# Patient Record
Sex: Male | Born: 1960 | Race: White | Hispanic: No | Marital: Married
Health system: Southern US, Community
[De-identification: ages and names within clinical notes are randomized; demographics above are authoritative.]

## PROBLEM LIST (undated history)

## (undated) DIAGNOSIS — S069X9A Unspecified intracranial injury with loss of consciousness of unspecified duration, initial encounter: Secondary | ICD-10-CM

## (undated) DIAGNOSIS — S069XAA Unspecified intracranial injury with loss of consciousness status unknown, initial encounter: Secondary | ICD-10-CM

---

## 2000-12-14 ENCOUNTER — Inpatient Hospital Stay (HOSPITAL_COMMUNITY): Admission: EM | Admit: 2000-12-14 | Discharge: 2000-12-15 | Payer: Self-pay | Admitting: Emergency Medicine

## 2000-12-15 ENCOUNTER — Encounter: Payer: Self-pay | Admitting: Internal Medicine

## 2019-05-27 ENCOUNTER — Emergency Department (HOSPITAL_COMMUNITY): Payer: BC Managed Care – PPO

## 2019-05-27 ENCOUNTER — Other Ambulatory Visit: Payer: Self-pay

## 2019-05-27 ENCOUNTER — Encounter (HOSPITAL_COMMUNITY): Payer: Self-pay | Admitting: Emergency Medicine

## 2019-05-27 ENCOUNTER — Emergency Department (HOSPITAL_COMMUNITY)
Admission: EM | Admit: 2019-05-27 | Discharge: 2019-05-27 | Disposition: A | Discharge status: Home / Self Care | Payer: BC Managed Care – PPO | Attending: Emergency Medicine | Admitting: Emergency Medicine

## 2019-05-27 DIAGNOSIS — R0789 Other chest pain: Secondary | ICD-10-CM | POA: Diagnosis not present

## 2019-05-27 DIAGNOSIS — E860 Dehydration: Secondary | ICD-10-CM | POA: Insufficient documentation

## 2019-05-27 DIAGNOSIS — R55 Syncope and collapse: Secondary | ICD-10-CM | POA: Diagnosis not present

## 2019-05-27 HISTORY — DX: Unspecified intracranial injury with loss of consciousness of unspecified duration, initial encounter: S06.9X9A

## 2019-05-27 HISTORY — DX: Unspecified intracranial injury with loss of consciousness status unknown, initial encounter: S06.9XAA

## 2019-05-27 LAB — CBC
HCT: 49.8 % (ref 39.0–52.0)
Hemoglobin: 17 g/dL (ref 13.0–17.0)
MCH: 30 pg (ref 26.0–34.0)
MCHC: 34.1 g/dL (ref 30.0–36.0)
MCV: 88 fL (ref 80.0–100.0)
Platelets: 243 10*3/uL (ref 150–400)
RBC: 5.66 MIL/uL (ref 4.22–5.81)
RDW: 13.3 % (ref 11.5–15.5)
WBC: 13.8 10*3/uL — ABNORMAL HIGH (ref 4.0–10.5)
nRBC: 0 % (ref 0.0–0.2)

## 2019-05-27 LAB — URINALYSIS, ROUTINE W REFLEX MICROSCOPIC
Bilirubin Urine: NEGATIVE
Glucose, UA: 50 mg/dL — AB
Hgb urine dipstick: NEGATIVE
Ketones, ur: NEGATIVE mg/dL
Leukocytes,Ua: NEGATIVE
Nitrite: NEGATIVE
Protein, ur: NEGATIVE mg/dL
Specific Gravity, Urine: 1.018 (ref 1.005–1.030)
pH: 6 (ref 5.0–8.0)

## 2019-05-27 LAB — TROPONIN I (HIGH SENSITIVITY)
Troponin I (High Sensitivity): 4 ng/L (ref <18)
Troponin I (High Sensitivity): 4 ng/L (ref <18)

## 2019-05-27 LAB — BASIC METABOLIC PANEL
Anion gap: 11 (ref 5–15)
BUN: 8 mg/dL (ref 6–20)
CO2: 22 mmol/L (ref 22–32)
Calcium: 9.3 mg/dL (ref 8.9–10.3)
Chloride: 104 mmol/L (ref 98–111)
Creatinine, Ser: 1.09 mg/dL (ref 0.61–1.24)
GFR calc Af Amer: >60 mL/min (ref >60)
GFR calc non Af Amer: >60 mL/min (ref >60)
Glucose, Bld: 146 mg/dL — ABNORMAL HIGH (ref 70–99)
Potassium: 3.8 mmol/L (ref 3.5–5.1)
Sodium: 137 mmol/L (ref 135–145)

## 2019-05-27 LAB — ETHANOL: Alcohol, Ethyl (B): <10 mg/dL (ref <10)

## 2019-05-27 LAB — CBG MONITORING, ED: Glucose-Capillary: 137 mg/dL — ABNORMAL HIGH (ref 70–99)

## 2019-05-27 LAB — D-DIMER, QUANTITATIVE: D-Dimer, Quant: <0.27 ug/mL-FEU (ref 0.00–0.50)

## 2019-05-27 MED ORDER — SODIUM CHLORIDE 0.9 % IV BOLUS
1000.0000 mL | Freq: Once | INTRAVENOUS | Status: AC
  Administered 2019-05-27: 1000 mL via INTRAVENOUS

## 2019-05-27 MED ORDER — SODIUM CHLORIDE 0.9% FLUSH: 3.0000 mL | Freq: Once | INTRAVENOUS | Status: DC

## 2019-05-27 MED ORDER — ONDANSETRON HCL 4 MG/2ML IJ SOLN
4.0000 mg | Freq: Once | INTRAMUSCULAR | Status: AC
  Administered 2019-05-27: 14:00:00 4 mg via INTRAVENOUS
  Filled 2019-05-27: qty 2

## 2019-05-27 NOTE — ED Notes (Signed)
Return from ct scan.

## 2019-05-27 NOTE — ED Triage Notes (Signed)
Pt reports feeling mid to left chest pain this morning with loss of consciousness. Pain is currently 5/10. Pt also endorses dizziness.

## 2019-05-27 NOTE — ED Triage Notes (Signed)
Patient arrived by [facility] FOLLOWING cp and reported syncopal episode. On arrival alert and oriented

## 2019-05-27 NOTE — ED Notes (Signed)
Patient Alert and oriented to baseline. Stable and ambulatory to baseline. Patient verbalized understanding of the discharge instructions.  Patient belongings were taken by the patient.   

## 2019-05-27 NOTE — ED Notes (Signed)
Pt given a urinal and notified that we need a urine sample. 

## 2019-05-27 NOTE — ED Notes (Signed)
Pt given a bag lunch and a water.

## 2019-05-27 NOTE — ED Notes (Signed)
Pt transported to CT ?

## 2019-05-27 NOTE — Discharge Instructions (Signed)
It is important for you to establish care with a primary care provider here. Return to the ED if you start to have worsening symptoms, worsening chest pain or shortness of breath, leg swelling, vomiting or coughing up blood, severe headache or blurry vision.

## 2019-05-27 NOTE — ED Provider Notes (Signed)
[facility]   CSN: [id number] Arrival date & time: 05/27/19  1104    History   Chief Complaint Chief Complaint  Patient presents with   Loss of Consciousness    HPI Kristopher Bradshaw is a 58 y.o. 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  States that he woke up this morning around 6 or 7 AM and had chest pain.  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  He was lifting a large battery when he states that "next thing I knew I was on the ground and they were trying to wake me up."  He denies history of similar symptoms in the past.  States that he is unsure if he had a head injury but denies any headache after the fall.  He continues to have chest pain which has been relieved with nitroglycerin given by EMS.  He states that he is starting to develop a slight headache from the nitroglycerin.  He states that he was in his usual state of health last night although he reports increased stress last night and did not eat properly for lunch or dinner.  Reports having an episode of diarrhea this morning.  He was put on an antihypertensive by his PCP when he lived in [place] but stopped taking it because he felt like it did not help him.  He has not yet established with a PCP here.  Denies any history of hyperlipidemia or diabetes.  Reports occasional alcohol use but denies any drug use, tobacco use, family or personal history of CAD, recent immobilization, leg swelling, hemoptysis, shortness of breath, fever, hormone use, abdominal pain or urinary symptoms.    HPI  Past Medical History:  Diagnosis Date   Brain injury (HCC)     There are no active problems to display for this patient.   History reviewed. No pertinent surgical history.      Home  Medications    Prior to Admission medications   Not on File    Family History No family history on file.  Social History Social History   Tobacco Use   Smoking status: Not on file  Substance Use Topics   Alcohol use: Not on file   Drug use: Not on file     Allergies   Patient has no known allergies.   Review of Systems Review of Systems  Constitutional: Negative for appetite change, chills and fever.  HENT: Negative for ear pain, rhinorrhea, sneezing and sore throat.   Eyes: Negative for photophobia and visual disturbance.  Respiratory: Negative for cough, chest tightness, shortness of breath and wheezing.   Cardiovascular: Positive for chest pain. Negative for palpitations.  Gastrointestinal: Negative for abdominal pain, blood in stool, constipation, diarrhea, nausea and vomiting.  Genitourinary: Negative for dysuria, hematuria and urgency.  Musculoskeletal: Negative for myalgias.  Skin: Negative for rash.  Neurological: Positive for syncope. Negative for dizziness, weakness and light-headedness.     Physical Exam Updated Vital Signs BP (!) 141/96    Pulse 65    Temp 98.2 F (36.8 C) (Oral)    Resp 18    SpO2 95%   Physical Exam Vitals signs and nursing note reviewed.  Constitutional:      General: He is not in acute  distress.    Appearance: He is well-developed.  HENT:     Head: Normocephalic and atraumatic.     Nose: Nose normal.  Eyes:     General: No scleral icterus.       Right eye: No discharge.        Left eye: No discharge.     Conjunctiva/sclera: Conjunctivae normal.     Pupils: Pupils are equal, round, and reactive to light.  Neck:     Musculoskeletal: Normal range of motion and neck supple.  Cardiovascular:     Rate and Rhythm: Normal rate and regular rhythm.     Heart sounds: Normal heart sounds. No murmur. No friction rub. No gallop.   Pulmonary:     Effort: Pulmonary effort is normal. No respiratory distress.     Breath sounds: Normal  breath sounds.  Abdominal:     General: Bowel sounds are normal. There is no distension.     Palpations: Abdomen is soft.     Tenderness: There is no abdominal tenderness. There is no guarding.  Musculoskeletal: Normal range of motion.     Comments: No lower extremity edema, erythema or calf tenderness bilaterally.  Skin:    General: Skin is warm and dry.     Findings: No rash.  Neurological:     General: No focal deficit present.     Mental Status: He is alert and oriented to person, place, and time. Mental status is at baseline.     Cranial Nerves: No cranial nerve deficit.     Sensory: No sensory deficit.     Motor: No weakness or abnormal muscle tone.     Coordination: Coordination normal.     Comments: Pupils reactive. No facial asymmetry noted. Cranial nerves appear grossly intact. Sensation intact to light touch on face, BUE and BLE. Strength 5/5 in BUE and BLE.      ED Treatments / Results  Labs (all labs ordered are listed, but only abnormal results are displayed) Labs Reviewed  BASIC METABOLIC PANEL - Abnormal; Notable for the following components:      Result Value   Glucose, Bld 146 (*)    All other components within normal limits  URINALYSIS, ROUTINE W REFLEX MICROSCOPIC - Abnormal; Notable for the following components:   Glucose, UA 50 (*)    All other components within normal limits  CBC - Abnormal; Notable for the following components:   WBC 13.8 (*)    All other components within normal limits  CBG MONITORING, ED - Abnormal; Notable for the following components:   Glucose-Capillary 137 (*)    All other components within normal limits  ETHANOL  D-DIMER, QUANTITATIVE (NOT AT [facility])  TROPONIN I (HIGH SENSITIVITY)  TROPONIN I (HIGH SENSITIVITY)    EKG EKG Interpretation  Date/Time:  Sunday May 27 2019 11:16:27 EDT Ventricular Rate:  65 PR Interval:  170 QRS Duration: 82 QT Interval:  398 QTC Calculation: 413 R Axis:   57 Text Interpretation:   Normal sinus rhythm Septal infarct , age undetermined Abnormal ECG Confirmed by Marianna Fussykstra, Richard ([id number]) on 05/27/2019 11:59:38 AM   Radiology Dg Chest 2 View  Result Date: 05/27/2019 CLINICAL DATA:  Left chest pain and shortness of breath. Syncopal episode. EXAM: CHEST - 2 VIEW COMPARISON:  None. FINDINGS: The heart size and mediastinal contours are normal. Probable calcified AP window lymph node and calcified granuloma in the left perihilar region. There is bilateral pleural thickening, but no significant pleural effusion. The lungs are otherwise  clear. Old rib fractures are present right. Telemetry leads overlie the chest. IMPRESSION: No evidence of acute cardiopulmonary process. Old rib fractures on the right with bilateral pleural thickening and sequela of prior granulomatous disease. Electronically Signed   By: Carey BullocksWilliam  Veazey M.D.   On: 05/27/2019 14:04   Ct Head Wo Contrast  Result Date: 05/27/2019 CLINICAL DATA:  Head trauma. High clinical risk. EXAM: CT HEAD WITHOUT CONTRAST TECHNIQUE: Contiguous axial images were obtained from the base of the skull through the vertex without intravenous contrast. COMPARISON:  None. FINDINGS: Brain: Geographic area of hypoattenuation in the cortical and subcortical right frontal lobe, likely chronic posttraumatic changes, judging by the posttraumatic changes in the overlying skull. No evidence of acute hemorrhage, hydrocephalus extra-axial collection or mass lesion. Vascular: No hyperdense vessel or unexpected calcification. Skull: Chronic appearing posttraumatic changes in the right frontal skull/frontal paranasal sinus. Negative for fracture or focal lesion. Sinuses/Orbits: Opacification of the left frontal sinus. Probably posttraumatic deformity of the right frontal sinus. Other: None. IMPRESSION: 1. Geographic area of hypoattenuation in the cortical and subcortical right frontal lobe, likely chronic posttraumatic changes, judging by the posttraumatic changes in  the overlying skull. Please correlate to patient's clinical history/prior imaging. 2. No evidence of acute intracranial hemorrhage. Electronically Signed   By: Ted Mcalpineobrinka  Dimitrova M.D.   On: 05/27/2019 14:00    Procedures Procedures (including critical care time)  Medications Ordered in ED Medications  sodium chloride flush (NS) 0.9 % injection 3 mL (has no administration in time range)  sodium chloride 0.9 % bolus 1,000 mL (0 mLs Intravenous Stopped 05/27/19 1350)  ondansetron (ZOFRAN) injection 4 mg (4 mg Intravenous Given 05/27/19 1402)     Initial Impression / Assessment and Plan / ED Course  I have reviewed the triage vital signs and the nursing notes.  Pertinent labs & imaging results that were available during my care of the patient were reviewed by me and considered in my medical decision making (see chart for details).  Clinical Course as of May 26 1538  Wynelle LinkSun May 27, 2019  1313 Troponin I (High Sensitivity) [RD]    Clinical Course User Index [RD] Milagros Lollykstra, Richard S, MD    HEAR Score: 332  58 year old male with past medical history of prior brain injury from trauma approximately 20 years ago presents to ED for chest pain syncope that began earlier today.  He woke up with a chest pain, had a syncopal episode while at work.  No history of similar symptoms in the past.  Certain develop a headache after given nitroglycerin by EMS.  Chest pain has gradually improved.  He does note increased stress lately especially last night and did not eat properly for lunch or dinner yesterday.  Had an episode of diarrhea this morning.  Notes remote history of hypertension but did denies use of antihypertensive.  Denies any daily alcohol use, tobacco use or other drug use.  On exam patient is overall well-appearing.  Chest is reproducible to palpation noted above.  No definite illogical exam noted.  No signs of head injury.  No lower extremity edema, erythema or calf tenderness noted concerning for DVT.   Lab work including BMP, CBC, d-dimer, initial ingestion troponin both negative.  EKG shows normal sinus rhythm.  Chest x-ray is unremarkable, old rib fractures noted.  CT of the head shows sequela of prior injury and surgeries but no acute findings.  Patient was given IV fluids, Zofran with significant near resolution of his symptoms.  He is  able to tolerate p.o. intake without difficulty.  He is requesting discharge.  He remains ambulatory without difficulty.  Chest pain has improved.  Suspect that symptoms could be combination of dehydration and increase in stress. Doubt CVA, ACS or other emergent cause of symptoms.  We will have him follow-up and establish care with PCP and return for worsening symptoms.  Patient is hemodynamically stable, in NAD, and able to ambulate in the ED. Evaluation does not show pathology that would require ongoing emergent intervention or inpatient treatment. I explained the diagnosis to the patient. Pain has been managed and has no complaints prior to discharge. Patient is comfortable with above plan and is stable for discharge at this time. All questions were answered prior to disposition. Strict return precautions for returning to the ED were discussed. Encouraged follow up with PCP.   An After Visit Summary was printed and given to the patient.   Portions of this note were generated with [employer]. Dictation errors may occur despite best attempts at proofreading.   Final Clinical Impressions(s) / ED Diagnoses   Final diagnoses:  Chest wall pain  Syncope and collapse  Dehydration    ED Discharge Orders    None       Delia Heady, PA-C 05/27/19 1539    Lucrezia Starch, MD 05/30/19 1427

## 2020-08-09 IMAGING — CT CT HEAD WITHOUT CONTRAST
3 of 4 series · 13 of 47 positions shown, 15 images · non-contrast
Comparison: None.

CLINICAL DATA: Head trauma. High clinical risk.

EXAM:
CT HEAD WITHOUT CONTRAST
TECHNIQUE: Contiguous axial images were obtained from the base of the skull
through the vertex without intravenous contrast.

[Series 3: head wo · axial · 0.42mm/px · z∈[-86,+29]mm · 7 of 31 slices shown, 9 images]
[im 4/31  brain]
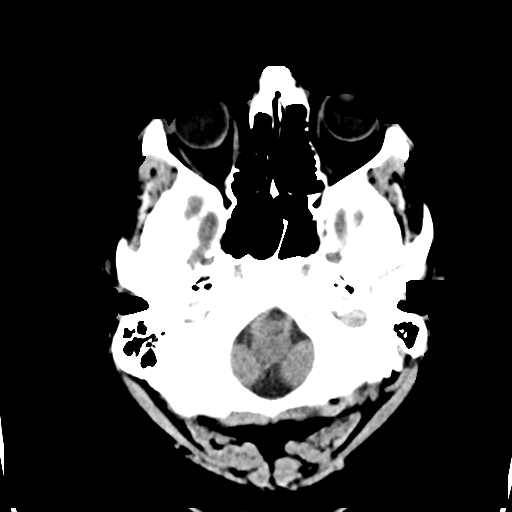
[im 4/31  bone]
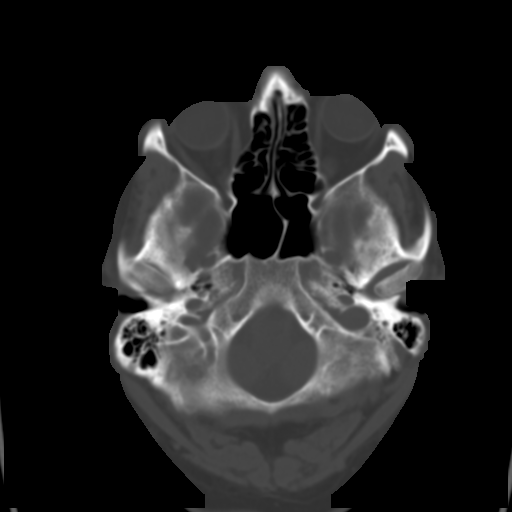
[im 8/31  brain]
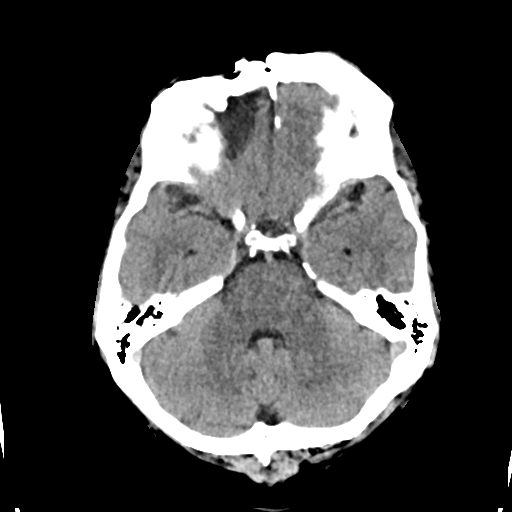
[im 12/31  brain]
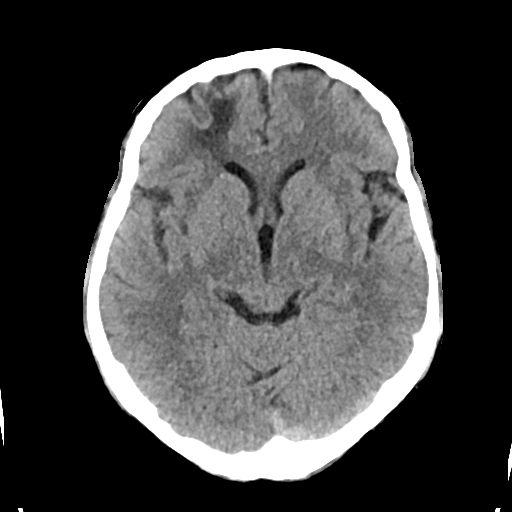
[im 16/31  brain]
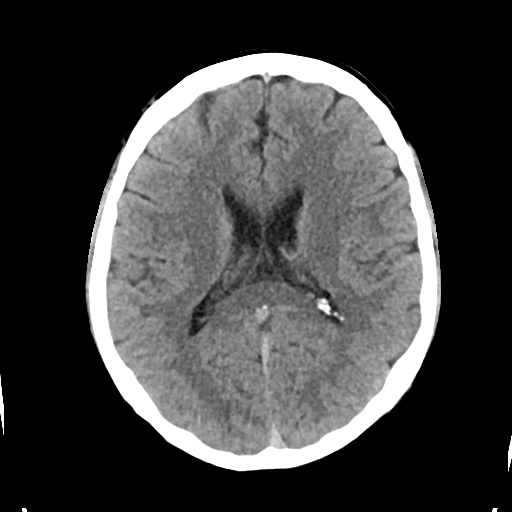
[im 19/31  brain]
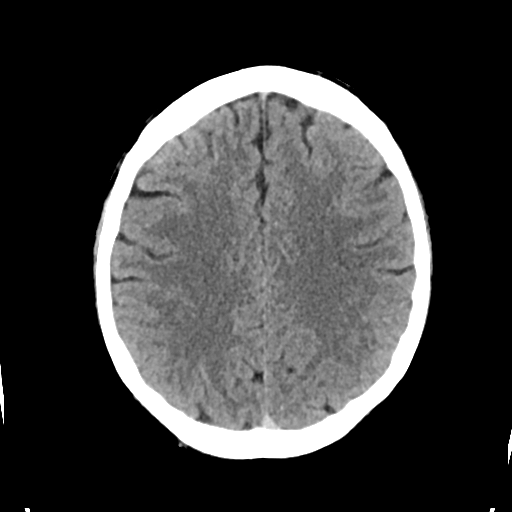
[im 19/31  bone]
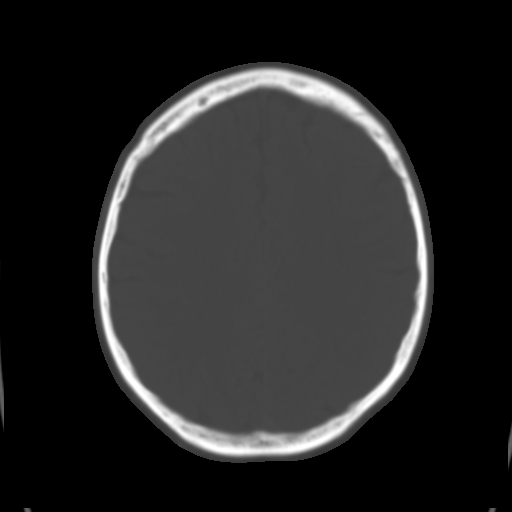
[im 23/31  brain]
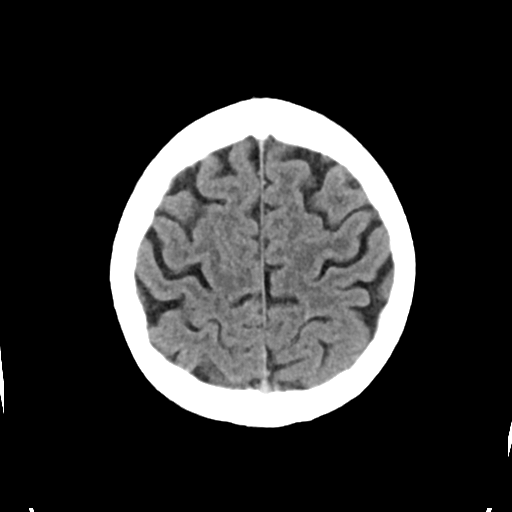
[im 27/31  brain]
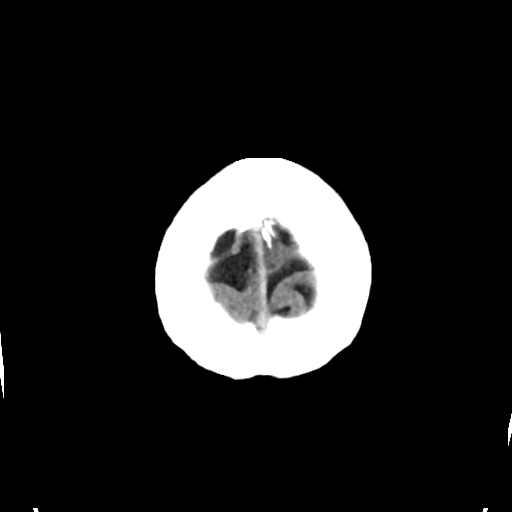

[Series 5: cor soft · coronal · 0.30mm/px · 3 of 65 slices shown]
[im 22/65  brain]
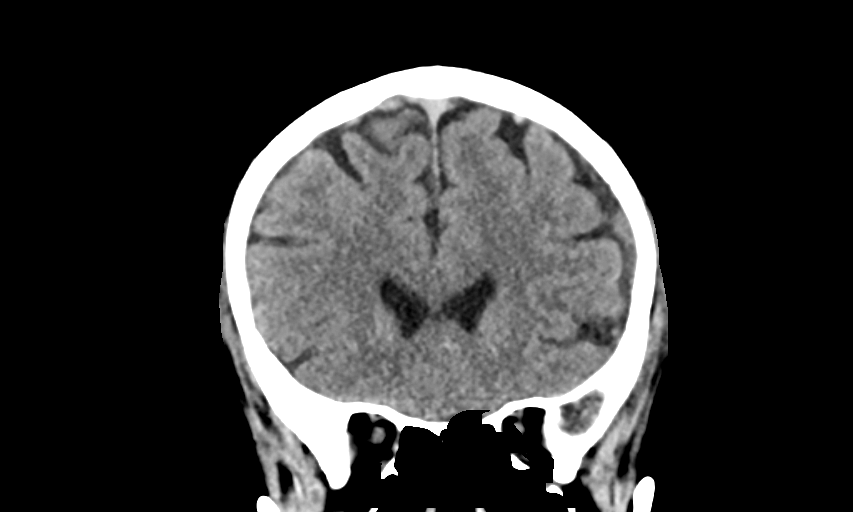
[im 29/65  brain]
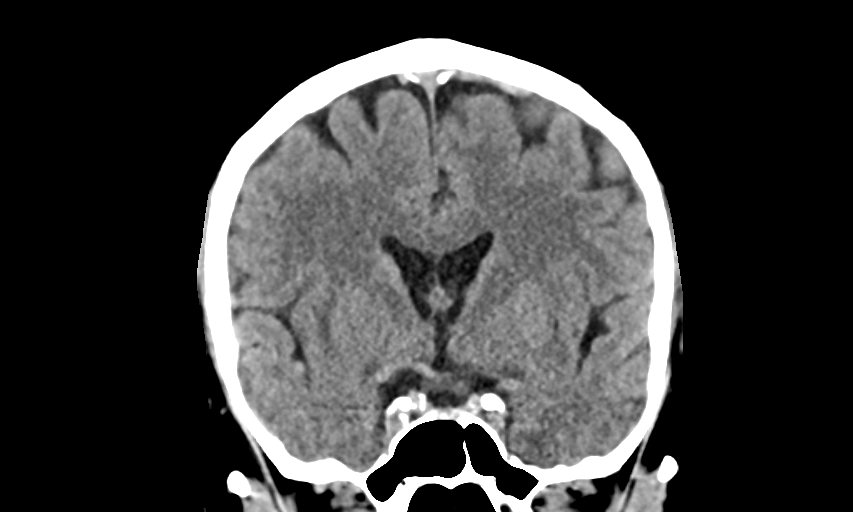
[im 36/65  brain]
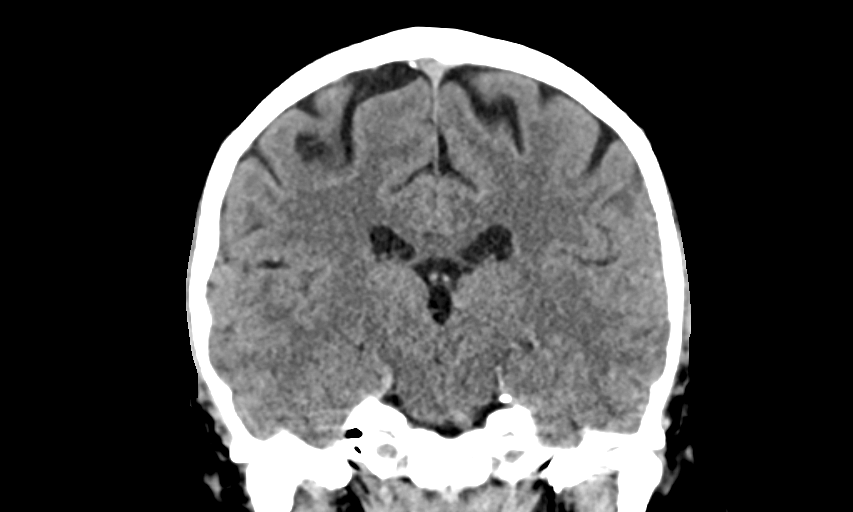

[Series 6: sag soft · sagittal · 0.33mm/px · 3 of 54 slices shown]
[im 18/54  brain]
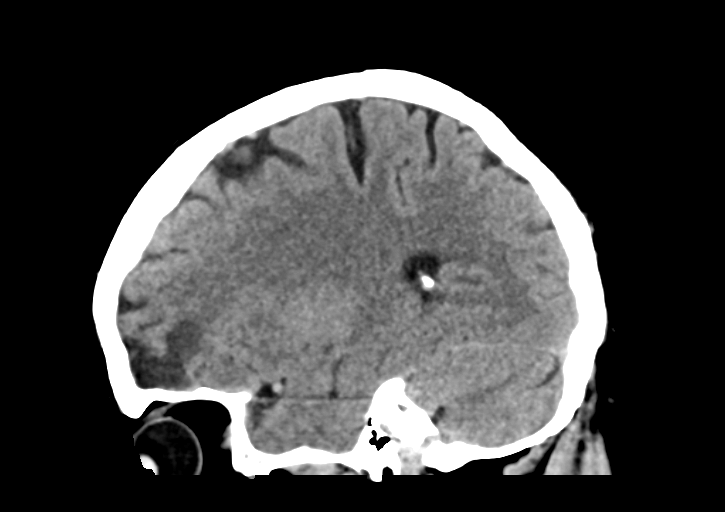
[im 27/54  brain]
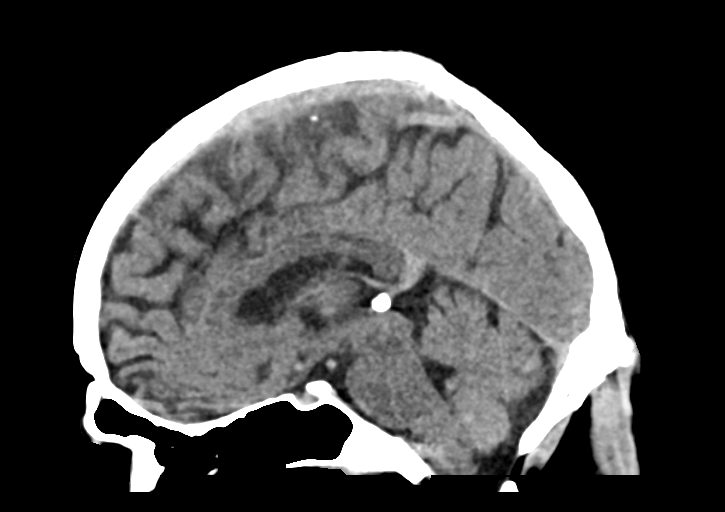
[im 36/54  brain]
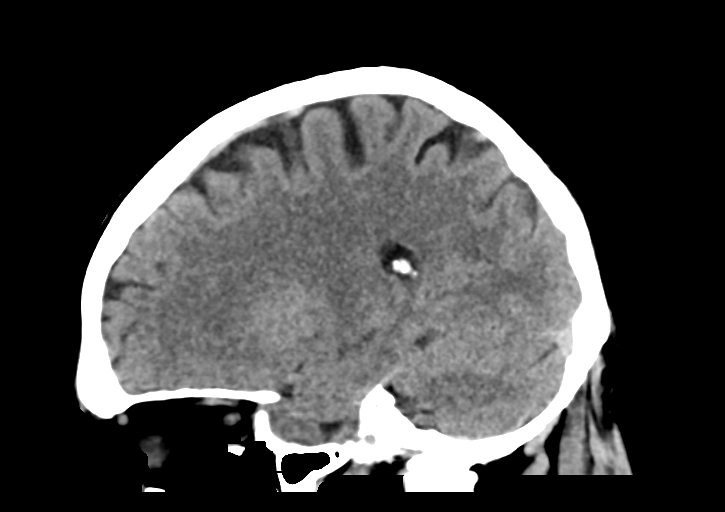

[13 of 47 positions shown; findings below may reference images not displayed]

FINDINGS: Brain: Geographic area of hypoattenuation in the cortical and
subcortical right frontal lobe, likely chronic posttraumatic
changes, judging by the posttraumatic changes in the overlying
skull. No evidence of acute hemorrhage, hydrocephalus extra-axial
collection or mass lesion.

Vascular: No hyperdense vessel or unexpected calcification.

Skull: Chronic appearing posttraumatic changes in the right frontal
skull/frontal paranasal sinus. Negative for fracture or focal
lesion.

Sinuses/Orbits: Opacification of the left frontal sinus. Probably
posttraumatic deformity of the right frontal sinus.

Other: None.
IMPRESSION: 1. Geographic area of hypoattenuation in the cortical and
subcortical right frontal lobe, likely chronic posttraumatic
changes, judging by the posttraumatic changes in the overlying
skull. Please correlate to patient's clinical history/prior imaging.
2. No evidence of acute intracranial hemorrhage.

## 2020-08-09 IMAGING — CR CHEST - 2 VIEW
2 series · 2 of 2 positions shown · non-contrast
Comparison: None.

CLINICAL DATA: Left chest pain and shortness of breath. Syncopal
episode.

EXAM:
CHEST - 2 VIEW

[chest pa]
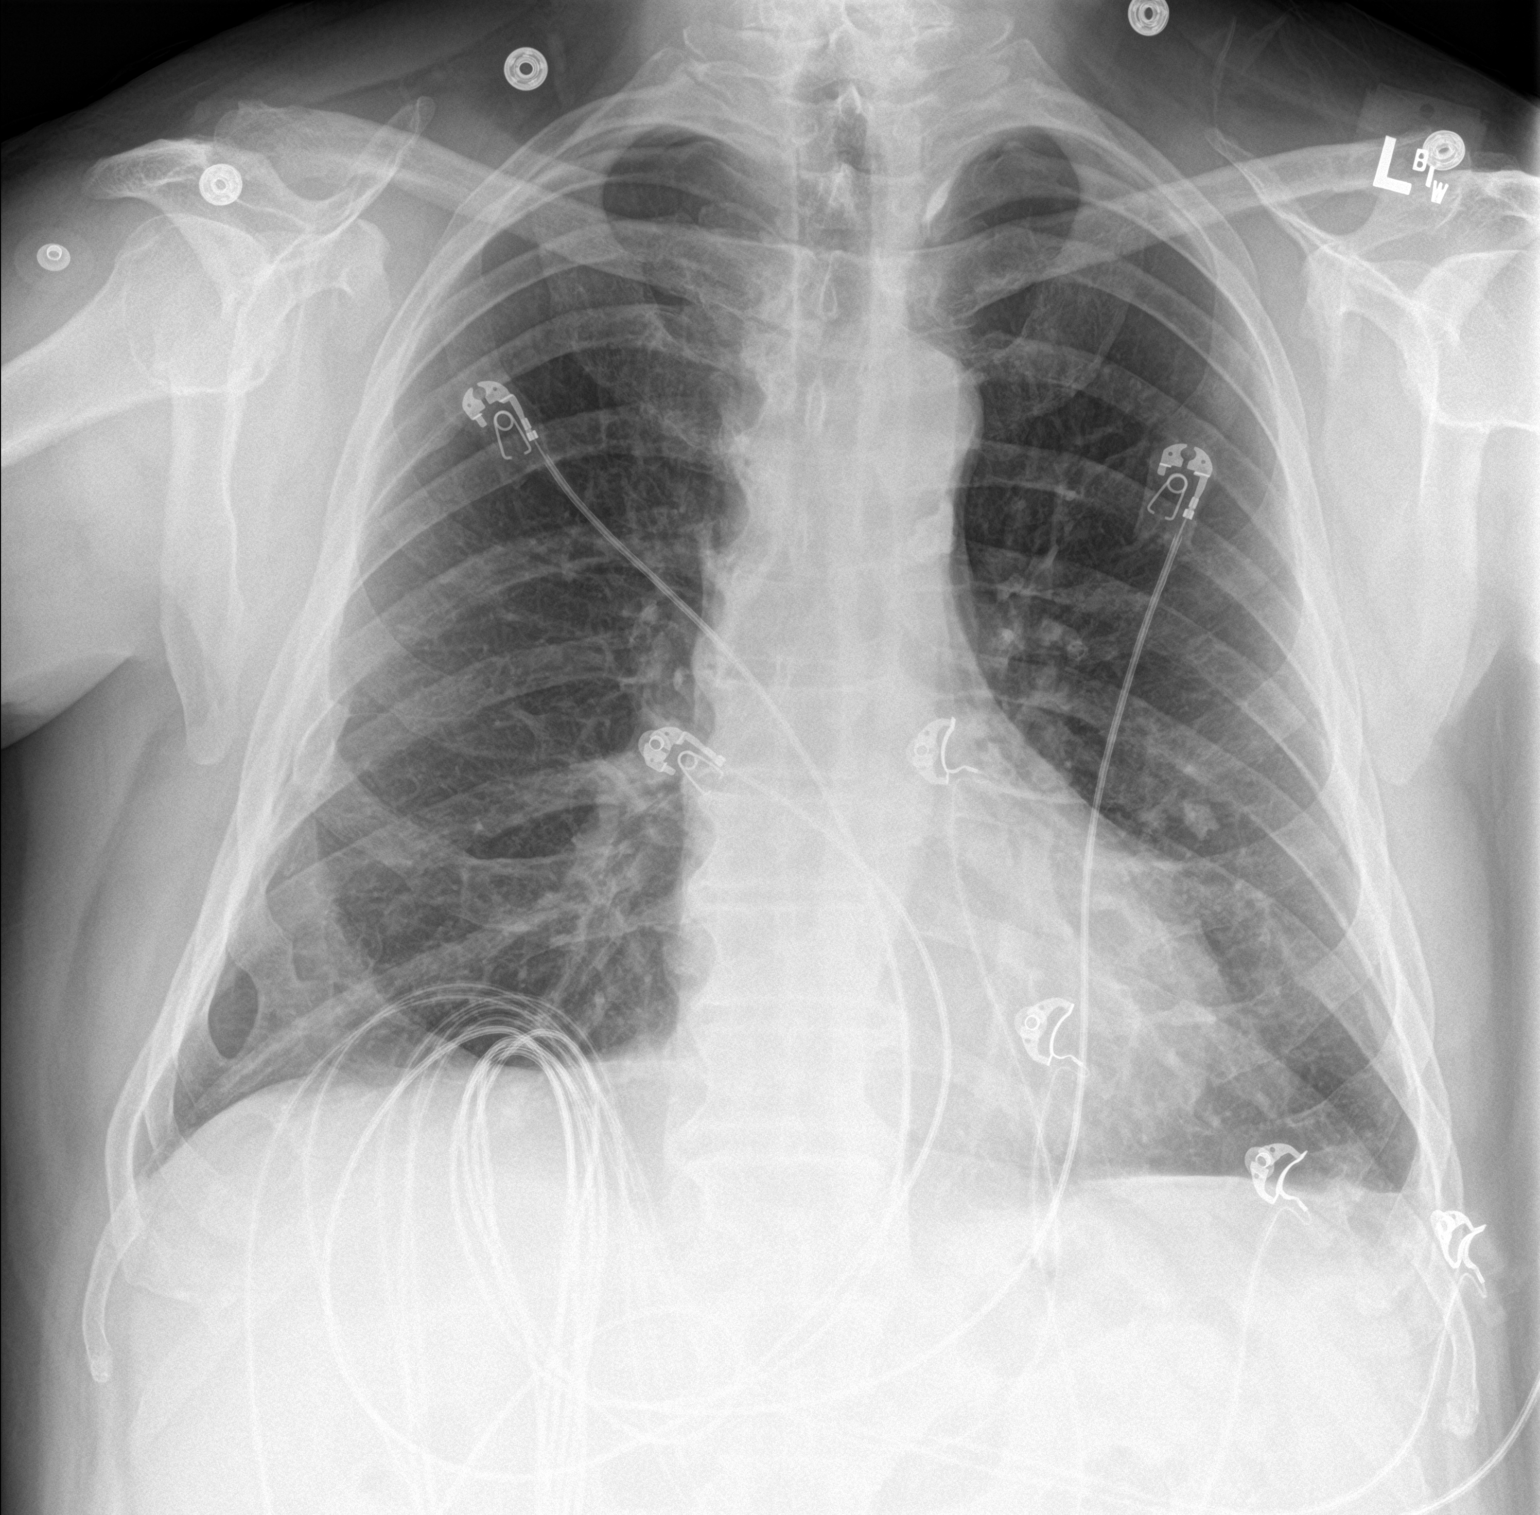

[chest lat]
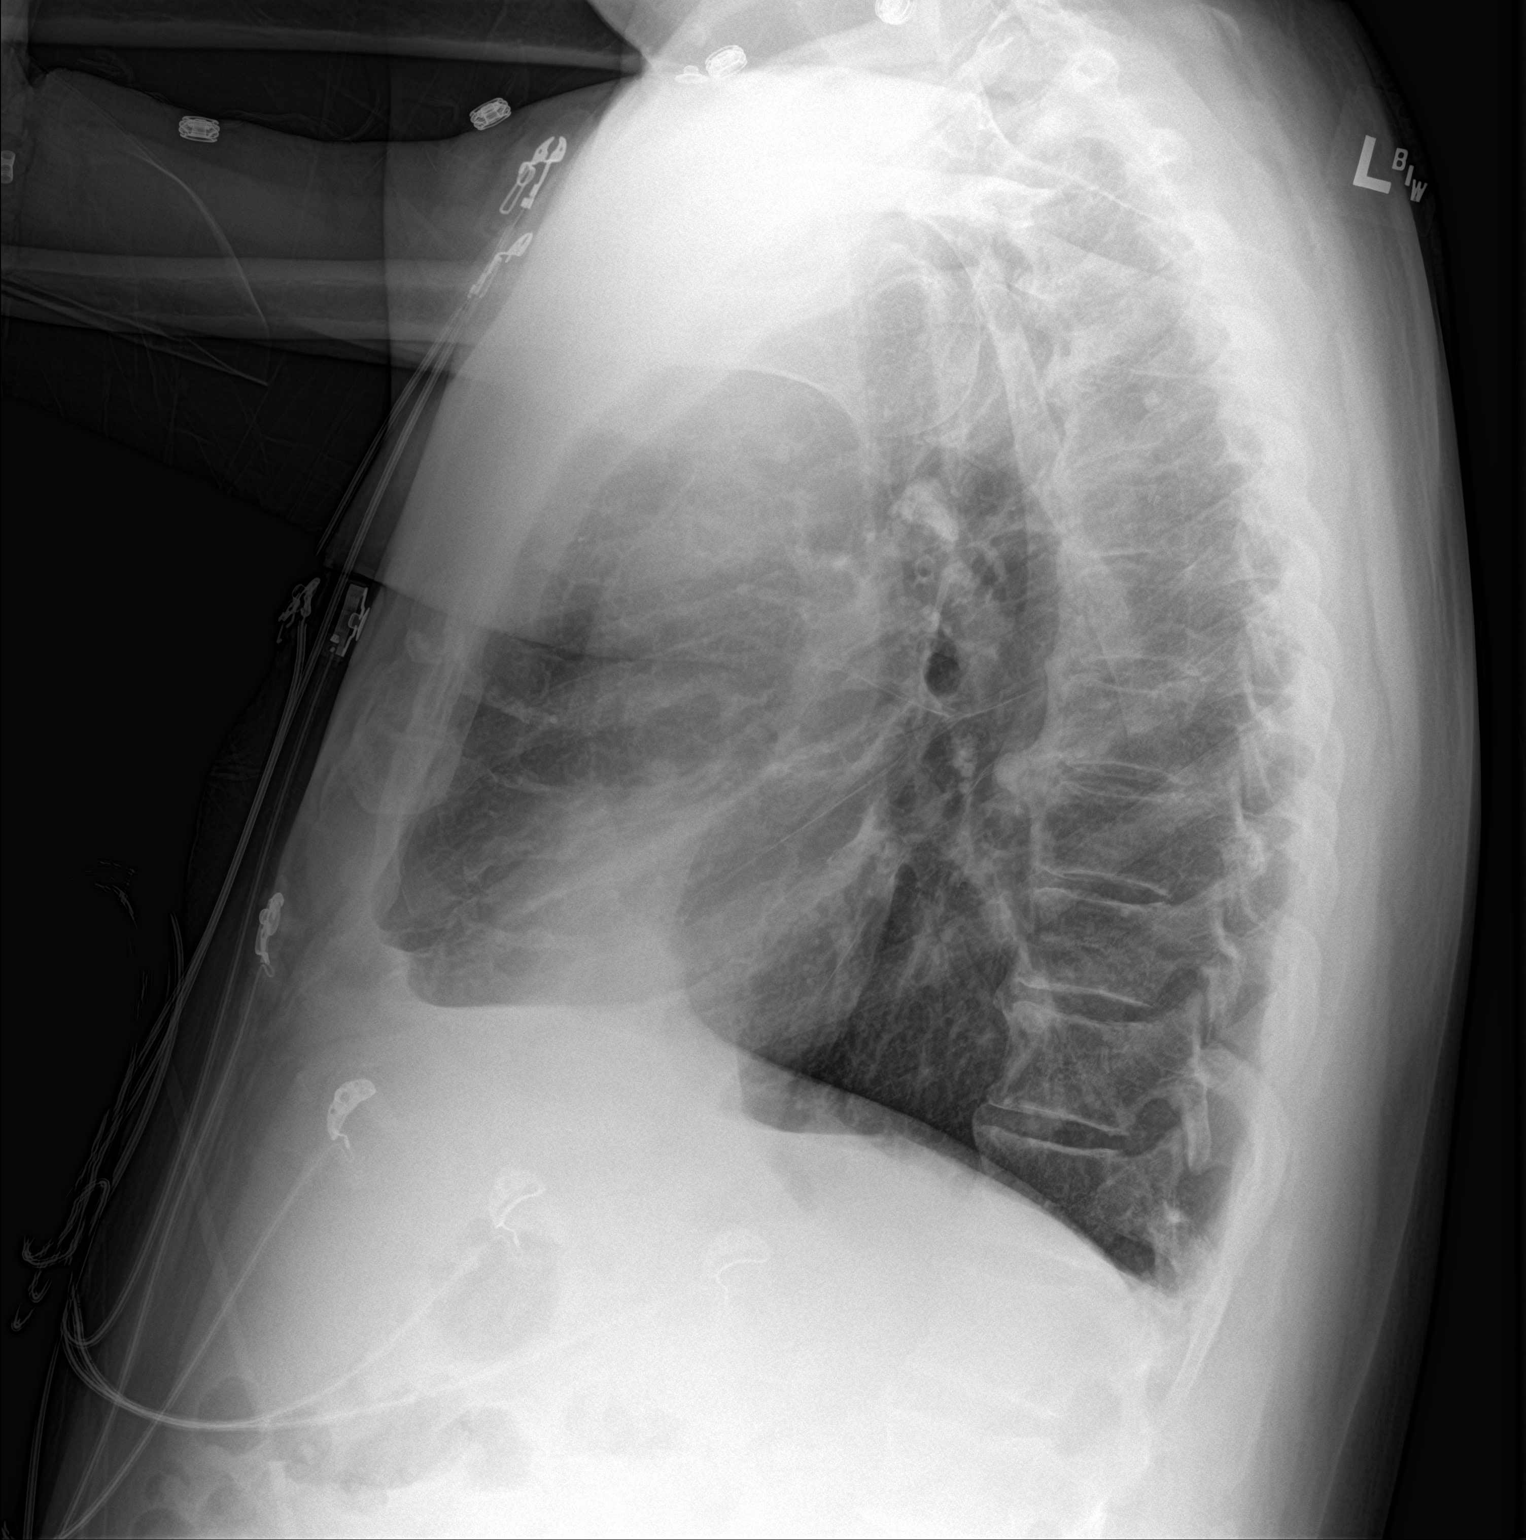

[2 of 2 positions shown; findings below may reference images not displayed]

FINDINGS: The heart size and mediastinal contours are normal. Probable
calcified AP window lymph node and calcified granuloma in the left
perihilar region. There is bilateral pleural thickening, but no
significant pleural effusion. The lungs are otherwise clear. Old rib
fractures are present right. Telemetry leads overlie the chest.
IMPRESSION: No evidence of acute cardiopulmonary process. Old rib fractures on
the right with bilateral pleural thickening and sequela of prior
granulomatous disease.
